# Patient Record
Sex: Female | Born: 1995 | Race: Black or African American | Hispanic: No | Marital: Single | Smoking: Never smoker
Health system: Southern US, Community
[De-identification: ages and names within clinical notes are randomized; demographics above are authoritative.]

---

## 2018-03-29 ENCOUNTER — Emergency Department (HOSPITAL_COMMUNITY)
Admission: EM | Admit: 2018-03-29 | Discharge: 2018-03-29 | Disposition: A | Discharge status: Home / Self Care | Payer: BLUE CROSS/BLUE SHIELD | Attending: Emergency Medicine | Admitting: Emergency Medicine

## 2018-03-29 ENCOUNTER — Encounter (HOSPITAL_COMMUNITY): Payer: Self-pay | Admitting: *Deleted

## 2018-03-29 ENCOUNTER — Other Ambulatory Visit: Payer: Self-pay

## 2018-03-29 DIAGNOSIS — L509 Urticaria, unspecified: Secondary | ICD-10-CM | POA: Diagnosis present

## 2018-03-29 DIAGNOSIS — T7840XA Allergy, unspecified, initial encounter: Secondary | ICD-10-CM | POA: Diagnosis not present

## 2018-03-29 MED ORDER — PREDNISONE 20 MG PO TABS: 60.0000 mg | ORAL_TABLET | Freq: Every day | ORAL | 0 refills | Status: AC

## 2018-03-29 MED ORDER — PREDNISONE 20 MG PO TABS
60.0000 mg | ORAL_TABLET | Freq: Once | ORAL | Status: AC
  Administered 2018-03-29: 60 mg via ORAL
  Filled 2018-03-29: qty 3

## 2018-03-29 NOTE — ED Provider Notes (Signed)
[facility]-EMERGENCY DEPT Provider Note   CSN: [id number] Arrival date & time: 03/29/18  2027     History   Chief Complaint Chief Complaint  Patient presents with  . Urticaria    HPI Bailey Miller is a 22 y.o. female.  Bailey Miller is a 22 y.o. Female who is otherwise healthy, presents to the emergency department for evaluation of hives.  Patient reports that on Friday she tried a new type of body oil that someone was selling in about 2 hours after using this product she noted a red burning rash starting on her scalp and moving down to her neck and arms.  She reports she used some Benadryl cream and also took a Benadryl pill and this seemed to help with the rash but once these medications were off it came right back over her arms chest and abdomen.  She reports she has avoided taking additional doses of Benadryl as this made her very drowsy and she did not like the side effects she has not tried anything else to treat her symptoms.  She denies any chest pain, shortness of breath, wheezing.  No facial swelling or throat closing sensation.  No nausea, vomiting or abdominal cramping, no lightheadedness or syncope.  No prior story of allergic reactions or anaphylaxis.     History reviewed. No pertinent past medical history.  There are no active problems to display for this patient.   History reviewed. No pertinent surgical history.   OB History   None      Home Medications    Prior to Admission medications   Medication Sig Start Date End Date Taking? Authorizing Provider  diphenhydrAMINE (BENADRYL) 25 MG tablet Take 25 mg by mouth once.   Yes [provider]  predniSONE (DELTASONE) 20 MG tablet Take 3 tablets (60 mg total) by mouth daily for 5 days. 03/29/18 04/03/18  Dartha Lodge, PA-C    Family History No family history on file.  Social History Social History   Tobacco Use  . Smoking status: Never Smoker  Substance Use Topics   . Alcohol use: Never    Frequency: Never  . Drug use: Never     Allergies   Patient has no allergy information on record.   Review of Systems Review of Systems  Constitutional: Negative for chills and fever.  HENT: Negative for facial swelling, trouble swallowing and voice change.   Respiratory: Negative for cough, shortness of breath, wheezing and stridor.   Cardiovascular: Negative for chest pain.  Gastrointestinal: Negative for abdominal pain, nausea and vomiting.  Skin: Positive for rash.  Neurological: Negative for dizziness, syncope and headaches.     Physical Exam Updated Vital Signs BP 132/74 (BP Location: Right Arm)   Pulse 90   Temp 98.2 F (36.8 C) (Oral)   Resp 16   Ht 4\' 11"  (1.499 m)   Wt 55.8 kg   LMP 03/05/2018 (Exact Date)   SpO2 100%   BMI 24.84 kg/m   Physical Exam  Constitutional: She appears well-developed and well-nourished. No distress.  Patient appears anxious but is in no acute distress  HENT:  Head: Normocephalic and atraumatic.  Mouth/Throat: Oropharynx is clear and moist.  No facial swelling or angioedema of the lips or tongue, posterior oropharynx is clear, mucous membrane  Eyes: Right eye exhibits no discharge. Left eye exhibits no discharge.  Neck: Neck supple.  Cardiovascular: Normal rate, regular rhythm, normal heart sounds and intact distal pulses. Exam reveals no gallop and  no friction rub.  No murmur heard. Pulmonary/Chest: Effort normal and breath sounds normal. No respiratory distress.  Respirations equal and unlabored, patient able to speak in full sentences, lungs clear to auscultation bilaterally  Abdominal: Soft. Bowel sounds are normal. She exhibits no distension and no mass. There is no tenderness. There is no guarding.  Abdomen soft, nondistended, nontender to palpation in all quadrants without guarding or peritoneal signs  Neurological: She is alert. Coordination normal.  Skin: Skin is warm and dry. Capillary refill  takes less than 2 seconds. Rash noted. She is not diaphoretic.  Urticaria noted over the trunk, neck and upper extremities, no vesicles, pustules or petechia  Psychiatric: She has a normal mood and affect. Her behavior is normal.  Nursing note and vitals reviewed.    ED Treatments / Results  Labs (all labs ordered are listed, but only abnormal results are displayed) Labs Reviewed - No data to display  EKG None  Radiology No results found.  Procedures Procedures (including critical care time)  Medications Ordered in ED Medications  predniSONE (DELTASONE) tablet 60 mg (60 mg Oral Given 03/29/18 2314)     Initial Impression / Assessment and Plan / ED Course  I have reviewed the triage vital signs and the nursing notes.  Pertinent labs & imaging results that were available during my care of the patient were reviewed by me and considered in my medical decision making (see chart for details).  Rash consistent with Urticaria. Patient denies any difficulty breathing or swallowing.  Pt has a patent airway without stridor and is handling secretions without difficulty; no angioedema. No blisters, no pustules, no warmth, no draining sinus tracts, no superficial abscesses, no bullous impetigo, no vesicles, no desquamation, no target lesions with dusky purpura or a central bulla. Not tender to touch. No concern for superimposed infection. No concern for SJS, TEN, TSS, tick borne illness, syphilis or other life-threatening condition. Will discharge home with short course of steroids, recommend zyrtec for itching.  Final Clinical Impressions(s) / ED Diagnoses   Final diagnoses:  Urticaria  Allergic reaction, initial encounter    ED Discharge Orders         Ordered    predniSONE (DELTASONE) 20 MG tablet  Daily     03/29/18 2330           Dartha Lodge, PA-C 03/30/18 0010    Arby Barrette, MD 03/30/18 2001

## 2018-03-29 NOTE — ED Triage Notes (Signed)
Pt stated "A man rubbed some body oils he was trying to sell on my arms.  My head started burning @ work, then it started coming down my neck.  I put Benadryl cream on it and it went down.  Now it's coming back.  I changed the sheets and showered.  I also Benadryl pill."  Pt in no distress @ this time.

## 2018-03-29 NOTE — ED Notes (Signed)
ED Provider at bedside. 

## 2018-03-29 NOTE — Discharge Instructions (Addendum)
Please take prednisone once daily for the next 5 days, take this in the morning with food.  Also like free to take 10 mg of Zyrtec twice daily, this should not make you feel drowsy like the Benadryl did.  These medication should help to improve your hives, this is likely from an allergic reaction.  Return to the emergency department for worsening hives, shortness of breath, facial swelling or swelling of the tongue or throat or any other new or concerning symptoms.

## 2018-11-25 DIAGNOSIS — Z113 Encounter for screening for infections with a predominantly sexual mode of transmission: Secondary | ICD-10-CM | POA: Diagnosis not present

## 2019-03-19 DIAGNOSIS — T7840XA Allergy, unspecified, initial encounter: Secondary | ICD-10-CM | POA: Diagnosis not present

## 2019-03-25 DIAGNOSIS — L700 Acne vulgaris: Secondary | ICD-10-CM | POA: Diagnosis not present

## 2019-05-21 DIAGNOSIS — Z30016 Encounter for initial prescription of transdermal patch hormonal contraceptive device: Secondary | ICD-10-CM | POA: Diagnosis not present

## 2019-05-21 DIAGNOSIS — Z113 Encounter for screening for infections with a predominantly sexual mode of transmission: Secondary | ICD-10-CM | POA: Diagnosis not present

## 2019-06-28 DIAGNOSIS — L700 Acne vulgaris: Secondary | ICD-10-CM | POA: Diagnosis not present

## 2019-08-04 DIAGNOSIS — J029 Acute pharyngitis, unspecified: Secondary | ICD-10-CM | POA: Diagnosis not present

## 2019-08-09 DIAGNOSIS — N760 Acute vaginitis: Secondary | ICD-10-CM | POA: Diagnosis not present

## 2019-08-09 DIAGNOSIS — Z113 Encounter for screening for infections with a predominantly sexual mode of transmission: Secondary | ICD-10-CM | POA: Diagnosis not present

## 2019-10-04 DIAGNOSIS — N760 Acute vaginitis: Secondary | ICD-10-CM | POA: Diagnosis not present

## 2019-10-04 DIAGNOSIS — Z113 Encounter for screening for infections with a predominantly sexual mode of transmission: Secondary | ICD-10-CM | POA: Diagnosis not present

## 2020-02-15 DIAGNOSIS — J309 Allergic rhinitis, unspecified: Secondary | ICD-10-CM | POA: Diagnosis not present

## 2020-03-26 DIAGNOSIS — Z20822 Contact with and (suspected) exposure to covid-19: Secondary | ICD-10-CM | POA: Diagnosis not present

## 2020-04-13 DIAGNOSIS — B070 Plantar wart: Secondary | ICD-10-CM | POA: Diagnosis not present

## 2020-04-17 DIAGNOSIS — Z20822 Contact with and (suspected) exposure to covid-19: Secondary | ICD-10-CM | POA: Diagnosis not present

## 2020-04-18 DIAGNOSIS — J309 Allergic rhinitis, unspecified: Secondary | ICD-10-CM | POA: Diagnosis not present

## 2020-04-18 DIAGNOSIS — B070 Plantar wart: Secondary | ICD-10-CM | POA: Diagnosis not present

## 2020-05-30 DIAGNOSIS — B070 Plantar wart: Secondary | ICD-10-CM | POA: Diagnosis not present

## 2020-06-12 DIAGNOSIS — Z113 Encounter for screening for infections with a predominantly sexual mode of transmission: Secondary | ICD-10-CM | POA: Diagnosis not present

## 2020-06-22 ENCOUNTER — Encounter (HOSPITAL_BASED_OUTPATIENT_CLINIC_OR_DEPARTMENT_OTHER): Payer: Self-pay | Admitting: Emergency Medicine

## 2020-06-22 ENCOUNTER — Other Ambulatory Visit: Payer: Self-pay

## 2020-06-22 ENCOUNTER — Emergency Department (HOSPITAL_BASED_OUTPATIENT_CLINIC_OR_DEPARTMENT_OTHER)
Admission: EM | Admit: 2020-06-22 | Discharge: 2020-06-22 | Disposition: A | Discharge status: Home / Self Care | Payer: BC Managed Care – PPO | Attending: Emergency Medicine | Admitting: Emergency Medicine

## 2020-06-22 DIAGNOSIS — M79674 Pain in right toe(s): Secondary | ICD-10-CM | POA: Diagnosis not present

## 2020-06-22 MED ORDER — DOXYCYCLINE HYCLATE 100 MG PO CAPS: 100.0000 mg | ORAL_CAPSULE | Freq: Two times a day (BID) | ORAL | 0 refills | Status: AC

## 2020-06-22 NOTE — ED Provider Notes (Signed)
Emergency Department Provider Note   I have reviewed the triage vital signs and the nursing notes.   HISTORY  Chief Complaint Plantar Warts   HPI Bailey Miller is a 25 y.o. female presents to the emergency department with atraumatic right toe pain starting this evening. Patient has a plantar wart on the right great toe which has undergone freezing treatments in the office of her podiatrist. Her next treatment is scheduled for next week. She last had a treatment on December 28 without complication. She noticed some swelling to the right medial toe with some redness. No trauma as stated previously. No fevers. No rapid spreading of redness up the foot or ankle.    History reviewed. No pertinent past medical history.  There are no problems to display for this patient.   History reviewed. No pertinent surgical history.  Allergies Patient has no known allergies.  No family history on file.  Social History Social History   Tobacco Use  . Smoking status: Never Smoker  Substance Use Topics  . Alcohol use: Never  . Drug use: Never    Review of Systems  Constitutional: No fever/chills Musculoskeletal: Right great toe pain.  Skin: Right toe redness. . Neurological: Negative for numbness. ____________________________________________   PHYSICAL EXAM:  VITAL SIGNS: ED Triage Vitals  Enc Vitals Group     BP 06/22/20 0114 133/79     Pulse Rate 06/22/20 0114 100     Resp 06/22/20 0114 16     Temp 06/22/20 0114 98.2 F (36.8 C)     Temp Source 06/22/20 0114 Oral     SpO2 06/22/20 0114 100 %     Weight 06/22/20 0113 128 lb (58.1 kg)     Height 06/22/20 0113 4\' 11"  (1.499 m)   Constitutional: Alert and oriented. Well appearing and in no acute distress. Eyes: Conjunctivae are normal. Head: Atraumatic. Nose: No congestion/rhinnorhea. Mouth/Throat: Mucous membranes are moist.   Neck: No stridor. Cardiovascular: Good peripheral circulation. Normal capillary refill.   Respiratory: Normal respiratory effort.  Gastrointestinal: No distention.  Musculoskeletal: No gross deformities of extremities. Neurologic:  Normal speech and language.  Skin:  Skin is warm and dry. Plantar wart to the bottom of the right great toe. Minimal erythema with mild swelling along the medial toe. No skin breakdown or ulceration. Normal capillary refill. No pain with range of motion of the toe joints. No erythema spreading over the foot.   ____________________________________________   PROCEDURES  Procedure(s) performed:   Procedures  None  ____________________________________________   INITIAL IMPRESSION / ASSESSMENT AND PLAN / ED COURSE  Pertinent labs & imaging results that were available during my care of the patient were reviewed by me and considered in my medical decision making (see chart for details).   Patient presents to the emergency department for evaluation of plantar wart to the right great toe with erythema over the toe. The last freeze procedure was several weeks ago. This does not appear to be a complication from that. No toe trauma. No concern for septic joint clinically. The toe is minimally erythematous and slightly inflamed. Plan for a cool compress along with ibuprofen at home. Will prescribe a watch and wait antibiotic in case this is some developing cellulitis adjacent to the warty area. Patient has podiatry follow-up next week. Discussed that if symptoms improve with Motrin and ice she can hold on taking the antibiotic.   ____________________________________________  FINAL CLINICAL IMPRESSION(S) / ED DIAGNOSES  Final diagnoses:  Pain of toe of  right foot    NEW OUTPATIENT MEDICATIONS STARTED DURING THIS VISIT:  New Prescriptions   DOXYCYCLINE (VIBRAMYCIN) 100 MG CAPSULE    Take 1 capsule (100 mg total) by mouth 2 (two) times daily for 7 days.    Note:  This document was prepared using Dragon voice recognition software and may include  unintentional dictation errors.  Alona Bene, MD, [facility] Emergency Medicine    Shanley Furlough, Arlyss Repress, MD 06/22/20 [phone]

## 2020-06-22 NOTE — Discharge Instructions (Signed)
You were seen in the emergency room today with right toe pain. I would try applying a cool compress such as a bag of ice or frozen peas wrapped in thin washcloth. This can help to reduce some swelling and ease irritation. He may take Tylenol and/or Motrin as needed for pain as well by following the instructions on the bottle. This does not appear obviously like infection at this time. I will prescribe an antibiotic but advised that you not start it immediately. Only start the antibiotic if redness spreads or swelling worsens. Please keep your appointment with your podiatrist in the coming week.

## 2020-06-22 NOTE — ED Triage Notes (Signed)
Pt has plantar wart on right foot that she has been getting treated. Pt states she now has swelling in the area. Last tx 12/28

## 2020-06-23 DIAGNOSIS — B070 Plantar wart: Secondary | ICD-10-CM | POA: Diagnosis not present

## 2020-07-25 DIAGNOSIS — Z0000 Encounter for general adult medical examination without abnormal findings: Secondary | ICD-10-CM | POA: Diagnosis not present

## 2020-07-25 DIAGNOSIS — Z13220 Encounter for screening for lipoid disorders: Secondary | ICD-10-CM | POA: Diagnosis not present

## 2020-07-25 DIAGNOSIS — N898 Other specified noninflammatory disorders of vagina: Secondary | ICD-10-CM | POA: Diagnosis not present

## 2020-07-25 DIAGNOSIS — N6321 Unspecified lump in the left breast, upper outer quadrant: Secondary | ICD-10-CM | POA: Diagnosis not present

## 2020-07-25 DIAGNOSIS — R300 Dysuria: Secondary | ICD-10-CM | POA: Diagnosis not present

## 2020-08-02 ENCOUNTER — Other Ambulatory Visit: Payer: Self-pay | Admitting: Internal Medicine

## 2020-08-02 DIAGNOSIS — N63 Unspecified lump in unspecified breast: Secondary | ICD-10-CM

## 2020-09-04 ENCOUNTER — Other Ambulatory Visit: Payer: Self-pay

## 2020-09-04 ENCOUNTER — Ambulatory Visit
Admission: RE | Admit: 2020-09-04 | Discharge: 2020-09-04 | Disposition: A | Discharge status: Home / Self Care | Payer: BC Managed Care – PPO | Source: Ambulatory Visit | Attending: Internal Medicine | Admitting: Internal Medicine

## 2020-09-04 DIAGNOSIS — N63 Unspecified lump in unspecified breast: Secondary | ICD-10-CM

## 2020-09-04 DIAGNOSIS — N6321 Unspecified lump in the left breast, upper outer quadrant: Secondary | ICD-10-CM | POA: Diagnosis not present

## 2021-03-02 DIAGNOSIS — B373 Candidiasis of vulva and vagina: Secondary | ICD-10-CM | POA: Diagnosis not present

## 2021-03-02 DIAGNOSIS — Z113 Encounter for screening for infections with a predominantly sexual mode of transmission: Secondary | ICD-10-CM | POA: Diagnosis not present

## 2021-03-21 DIAGNOSIS — B3749 Other urogenital candidiasis: Secondary | ICD-10-CM | POA: Diagnosis not present

## 2021-03-21 DIAGNOSIS — R350 Frequency of micturition: Secondary | ICD-10-CM | POA: Diagnosis not present

## 2021-04-27 DIAGNOSIS — Z09 Encounter for follow-up examination after completed treatment for conditions other than malignant neoplasm: Secondary | ICD-10-CM | POA: Diagnosis not present

## 2021-05-02 DIAGNOSIS — Z30016 Encounter for initial prescription of transdermal patch hormonal contraceptive device: Secondary | ICD-10-CM | POA: Diagnosis not present

## 2021-05-06 DIAGNOSIS — O039 Complete or unspecified spontaneous abortion without complication: Secondary | ICD-10-CM | POA: Diagnosis not present

## 2021-05-06 DIAGNOSIS — Z0279 Encounter for issue of other medical certificate: Secondary | ICD-10-CM | POA: Diagnosis not present

## 2021-06-16 DIAGNOSIS — R300 Dysuria: Secondary | ICD-10-CM | POA: Diagnosis not present

## 2021-07-16 DIAGNOSIS — Z20822 Contact with and (suspected) exposure to covid-19: Secondary | ICD-10-CM | POA: Diagnosis not present

## 2021-08-21 DIAGNOSIS — R209 Unspecified disturbances of skin sensation: Secondary | ICD-10-CM | POA: Diagnosis not present

## 2021-08-21 DIAGNOSIS — L700 Acne vulgaris: Secondary | ICD-10-CM | POA: Diagnosis not present

## 2021-08-21 DIAGNOSIS — L810 Postinflammatory hyperpigmentation: Secondary | ICD-10-CM | POA: Diagnosis not present

## 2021-08-21 DIAGNOSIS — Z79899 Other long term (current) drug therapy: Secondary | ICD-10-CM | POA: Diagnosis not present

## 2021-11-01 DIAGNOSIS — L810 Postinflammatory hyperpigmentation: Secondary | ICD-10-CM | POA: Diagnosis not present

## 2021-11-01 DIAGNOSIS — Z79899 Other long term (current) drug therapy: Secondary | ICD-10-CM | POA: Diagnosis not present

## 2021-11-01 DIAGNOSIS — R209 Unspecified disturbances of skin sensation: Secondary | ICD-10-CM | POA: Diagnosis not present

## 2021-11-01 DIAGNOSIS — L700 Acne vulgaris: Secondary | ICD-10-CM | POA: Diagnosis not present

## 2021-11-13 DIAGNOSIS — J309 Allergic rhinitis, unspecified: Secondary | ICD-10-CM | POA: Diagnosis not present

## 2022-10-01 IMAGING — US US BREAST*L* LIMITED INC AXILLA
1 series · 4 of 4 positions shown · non-contrast
Comparison: None.

CLINICAL DATA: Palpable mass in the outer aspect of the left breast
on recent physical examination. This was described as being in the 1
o'clock position. The patient reports that she has had symmetrical
mass-like areas in the outer aspects of both breasts since age 18.
At that time, she says these were evaluated and are unchanged.
Family history of breast cancer in a great great grandmother.

EXAM:
ULTRASOUND OF THE LEFT BREAST

[Series 1: us breast*left* limited inc axilla · 0.06mm/px · 4 of 4 slices shown]
[im 1/4]
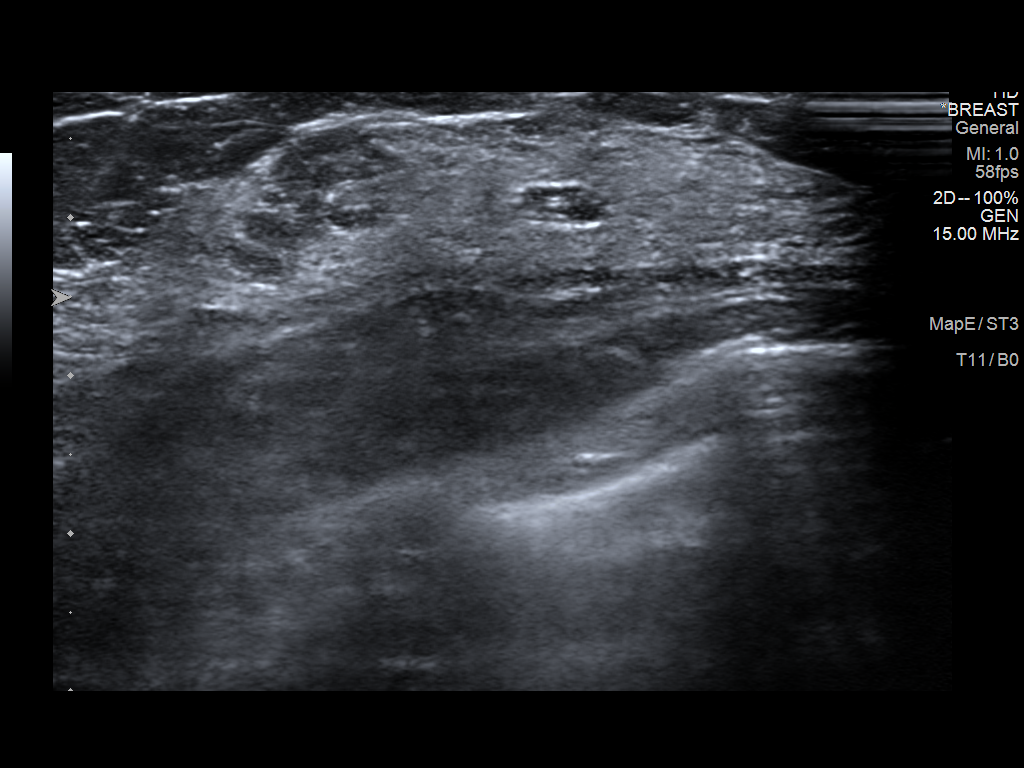
[im 2/4]
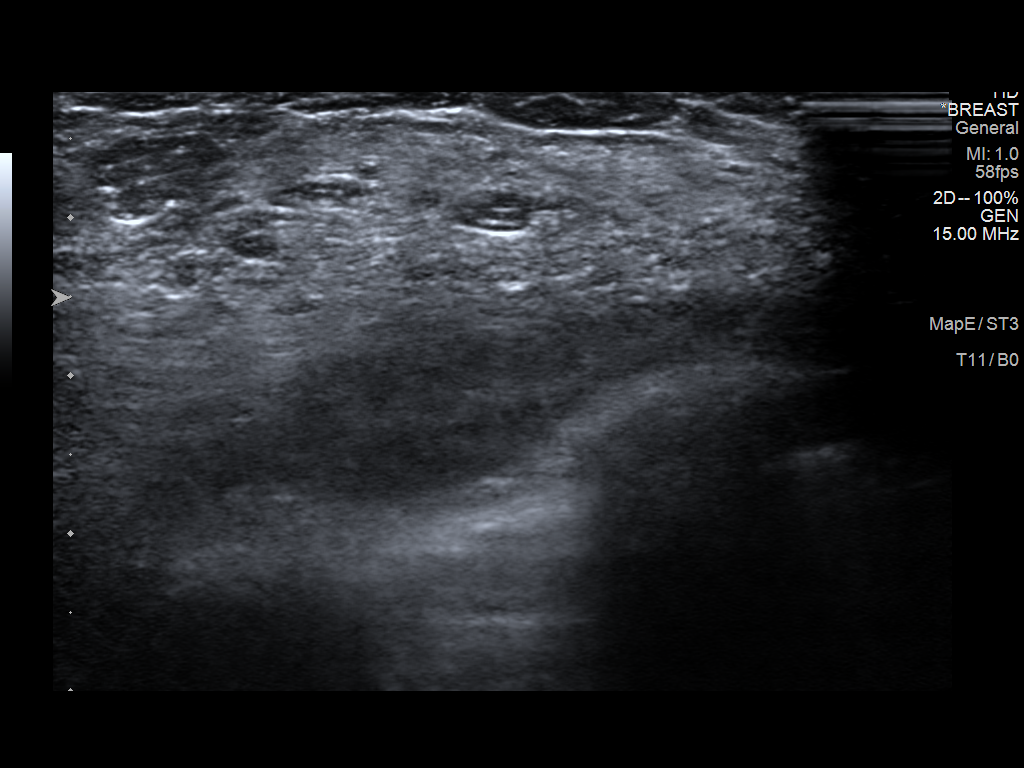
[im 3/4]
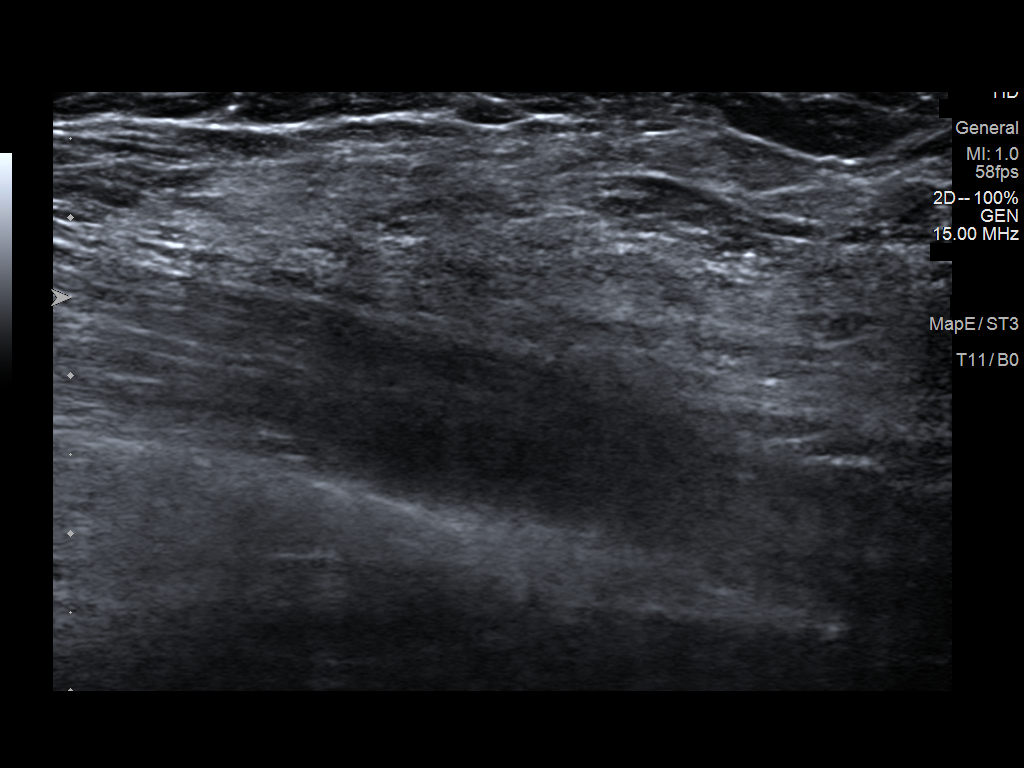
[im 4/4]
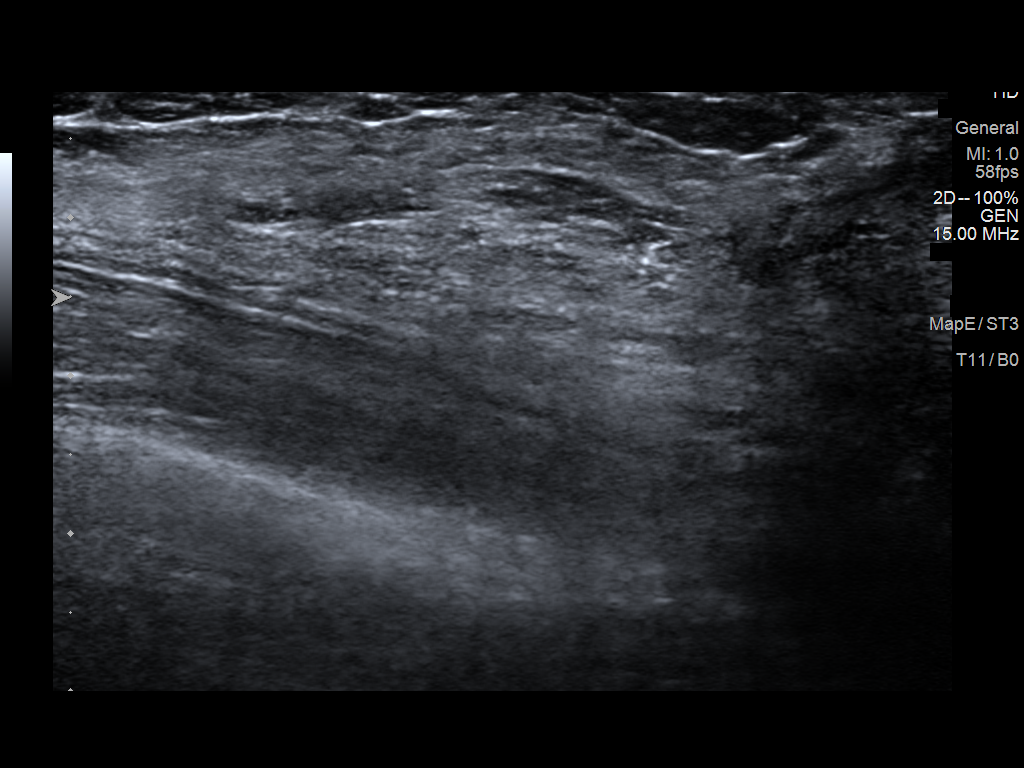

[4 of 4 positions shown; findings below may reference images not displayed]

FINDINGS: On physical exam, there is mild palpable soft tissue thickening
centered in the 1:30 o'clock position of the left breast, 4 cm from
the nipple. No discrete mass is palpable in the patient is not
tender to palpation today.

Targeted ultrasound is performed, showing normal appearing breast
tissue throughout the upper outer left breast. This includes normal
dense fibroglandular tissue with a convex anterior margin at the
1:30 o'clock position, 4 cm from the nipple, corresponding to the
palpable thickening.
IMPRESSION: No evidence of malignancy.

RECOMMENDATION:
Annual screening mammography beginning at age 40.

I have discussed the findings and recommendations with the patient.
If applicable, a reminder letter will be sent to the patient
regarding the next appointment.

BI-RADS CATEGORY  1: Negative.
